# Patient Record
Sex: Male | Born: 1960 | Race: White | Hispanic: No | Marital: Single | Smoking: Current every day smoker
Health system: Southern US, Community
[De-identification: ages and names within clinical notes are randomized; demographics above are authoritative.]

## PROBLEM LIST (undated history)

## (undated) DIAGNOSIS — J329 Chronic sinusitis, unspecified: Secondary | ICD-10-CM

## (undated) HISTORY — DX: Chronic sinusitis, unspecified: J32.9

## (undated) HISTORY — PX: NO PAST SURGERIES: SHX2092

---

## 2010-11-24 ENCOUNTER — Ambulatory Visit: Payer: Self-pay

## 2015-06-10 ENCOUNTER — Ambulatory Visit (INDEPENDENT_AMBULATORY_CARE_PROVIDER_SITE_OTHER): Payer: BLUE CROSS/BLUE SHIELD | Admitting: Family Medicine

## 2015-06-10 ENCOUNTER — Encounter: Payer: Self-pay | Admitting: Family Medicine

## 2015-06-10 VITALS — BP 128/72 | HR 70 | Temp 98.2°F | Resp 16 | Ht 78.0 in | Wt 204.6 lb

## 2015-06-10 DIAGNOSIS — H9201 Otalgia, right ear: Secondary | ICD-10-CM | POA: Diagnosis not present

## 2015-06-10 MED ORDER — NEOMYCIN-POLYMYXIN-HC 3.5-10000-1 OT SOLN: 4.0000 [drp] | Freq: Four times a day (QID) | OTIC | Status: DC

## 2015-06-10 NOTE — Progress Notes (Signed)
Subjective:     Patient ID: Hunter Perez, male   DOB: 03-Jun-1961, 54 y.o.   MRN: [id number]  HPI  Chief Complaint  Patient presents with  . Ear Pain    Patient comes in office today with concerns of ear pain on the right side since Friday, he describes pain as dull and has been gradually getting worse over the past two days  Denies precedent cold/allergy sx, swimming or bruxism. Had recent oral surgery but it did not involve his posterior teeth. He does chew several pieces of gum daily.   Review of Systems  Constitutional: Negative for fever and chills.  HENT: Negative for hearing loss.        Objective:   Physical Exam  HENT:  Left Ear: External ear normal.  Right ear canal patent with normal TM but mildly erythematous when compared to the left. Mild tragal and mastoid area tenderness. No tenderness on percussion of his right posterior molars.  Musculoskeletal:  No TMJ tenderness. Cervical FROM without ear pain       Assessment:    1. Otalgia of right ear - neomycin-polymyxin-hydrocortisone (CORTISPORIN) otic solution; Place 4 drops into the right ear 4 (four) times daily.  Dispense: 10 mL; Refill: 0    Plan:     Start two Aleve twice daily with food. Hold gum chewing.

## 2015-06-10 NOTE — Patient Instructions (Signed)
Discussed use of two Aleve twice daily with food.

## 2015-08-19 ENCOUNTER — Ambulatory Visit
Admission: RE | Admit: 2015-08-19 | Discharge: 2015-08-19 | Disposition: A | Discharge status: Home / Self Care | Payer: BLUE CROSS/BLUE SHIELD | Source: Ambulatory Visit | Attending: Family Medicine | Admitting: Family Medicine

## 2015-08-19 ENCOUNTER — Ambulatory Visit (INDEPENDENT_AMBULATORY_CARE_PROVIDER_SITE_OTHER): Payer: BLUE CROSS/BLUE SHIELD | Admitting: Family Medicine

## 2015-08-19 ENCOUNTER — Encounter: Payer: Self-pay | Admitting: Family Medicine

## 2015-08-19 VITALS — BP 118/80 | HR 90 | Temp 98.3°F | Resp 16 | Wt 207.2 lb

## 2015-08-19 DIAGNOSIS — F17200 Nicotine dependence, unspecified, uncomplicated: Secondary | ICD-10-CM | POA: Insufficient documentation

## 2015-08-19 DIAGNOSIS — M25562 Pain in left knee: Secondary | ICD-10-CM | POA: Diagnosis not present

## 2015-08-19 DIAGNOSIS — L679 Hair color and hair shaft abnormality, unspecified: Secondary | ICD-10-CM

## 2015-08-19 DIAGNOSIS — M11262 Other chondrocalcinosis, left knee: Secondary | ICD-10-CM | POA: Insufficient documentation

## 2015-08-19 DIAGNOSIS — N529 Male erectile dysfunction, unspecified: Secondary | ICD-10-CM | POA: Insufficient documentation

## 2015-08-19 NOTE — Progress Notes (Signed)
Patient ID: Hunter Perez, male   DOB: September 20, 1961, 54 y.o.   MRN: [id number]   Patient: Hunter Perez Male    DOB: May 03, 1961   54 y.o.   MRN: [id number] Visit Date: 08/19/2015  Today's Provider: Dortha Kern, PA   Chief Complaint  Patient presents with  . Knee Pain    left knee pain X 2 days. Patient states he woke up from a nap to knee pain. Patient denies injury to the knee   Subjective:    Knee Pain  The incident occurred 2 days ago. The incident occurred at home. There was no injury mechanism. The pain is present in the left knee. The quality of the pain is described as aching and stabbing. The pain is at a severity of 8/10. The pain has been constant since onset. Associated symptoms include a loss of motion. Pertinent negatives include no muscle weakness or numbness. The symptoms are aggravated by movement.  Initial pain started as he rolled over in bed. Has a history of snow skiing a great deal in the past. No past problems with knees. Unable to flex the knee due to stabbing pain.    Past Surgical History  Procedure Laterality Date  . No past surgeries     Patient Active Problem List   Diagnosis Date Noted  . 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 Tobacco use disorder 08/19/2015  . Hair disease 08/19/2015   No Known Allergies  Previous Medications   ASCORBIC ACID (VITAMIN C PO)    Take 1 tablet by mouth daily.   FLUTICASONE (FLONASE) 50 MCG/ACT NASAL SPRAY    Place 2 sprays into both nostrils at bedtime.   IBUPROFEN (ADVIL,MOTRIN) 200 MG TABLET    Take 200 mg by mouth as needed.   TADALAFIL (CIALIS) 10 MG TABLET    Take 10 mg by mouth daily as needed for erectile dysfunction (limit one every 48 hours).    Review of Systems  Constitutional: Negative.  Negative for fever.  Gastrointestinal: Negative.   Musculoskeletal: Positive for arthralgias. Negative for joint swelling.  Skin: Negative.   Neurological: Negative.  Negative for numbness.   Family History  Problem  Relation Age of Onset  . Healthy Mother   . Healthy Father   . Healthy Brother    Social History  Substance Use Topics  . Smoking status: Current Every Day Smoker -- 1.00 packs/day    Types: Cigarettes  . Smokeless tobacco: Not on file  . Alcohol Use: 0.0 oz/week    0 Standard drinks or equivalent per week     Comment: occasional   Objective:   BP 118/80 mmHg  Pulse 90  Temp(Src) 98.3 F (36.8 C) (Oral)  Resp 16  Wt 207 lb 3.2 oz (93.985 kg)  SpO2 98%  Physical Exam  Constitutional: He is oriented to person, place, and time. He appears well-developed and well-nourished. No distress.  HENT:  Head: Normocephalic and atraumatic.  Right Ear: Hearing normal.  Left Ear: Hearing normal.  Nose: Nose normal.  Eyes: Conjunctivae and lids are normal. Right eye exhibits no discharge. Left eye exhibits no discharge. No scleral icterus.  Pulmonary/Chest: Effort normal. No respiratory distress.  Musculoskeletal: He exhibits tenderness. He exhibits no edema.  Exquisite pain to palpate medial joint line of the left knee. Some friction rub felt to palpate patella. No effusion or swelling. Unable to allow flexion. No pain to test collateral ligaments. Very apprehensive.  Neurological: He is alert and oriented to person, place,  and time.  Skin: Skin is intact. No lesion and no rash noted.  Psychiatric: He has a normal mood and affect. His speech is normal and behavior is normal. Thought content normal.      Assessment & Plan:     1. Left knee pain Onset of stabbing and aching pain in the medial side of the left knee over the past 2 days. Initial pain started when he turned over in bed. No recent trauma/injury known. Has a very long history of snow skiing. 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 Will get x-ray evaluation and use knee immobilizer with Ibuprofen 200 mg 3-4 tablets by mouth TID with food. Elevate and rest as much as possible. Pending report,  may need referral to orthopedist (states he prefers [facility]. - DG Knee Complete 4 Views Left; Future

## 2015-08-20 ENCOUNTER — Telehealth: Payer: Self-pay

## 2015-08-20 NOTE — Telephone Encounter (Signed)
-----   Message from Tamsen Roers, [place] sent at 08/19/2015  6:07 PM EDT ----- No sign of bony abnormality in the knee. Calcifications in the cartilage of the joint noted. If no better with the use of the knee immobilizer and Ibuprofen 600-800 mg TID with food, will need referral to an orthopedist. May apply moist heat or ice packs if needed for additional relief of pain.

## 2015-08-20 NOTE — Telephone Encounter (Signed)
Patient advised as directed below. Patient verbalized understanding and agrees with treatment plan. 

## 2015-08-20 NOTE — Telephone Encounter (Signed)
LMTCB

## 2015-12-18 ENCOUNTER — Telehealth: Payer: Self-pay | Admitting: Family Medicine

## 2015-12-18 NOTE — Telephone Encounter (Signed)
Pt called wanting someone to call him in an antibiotic for a sinus infection.  Offered appt.  He did not want to come in.  Wanted to ask  Nadine CountsBob if he would call something in.  Call back is  [phone]  Thanks Barth Kirkseri

## 2015-12-18 NOTE — Telephone Encounter (Signed)
Left patient a voicemail advising him that Nadine CountsBob states he will need a OV for a RX.

## 2015-12-18 NOTE — Telephone Encounter (Signed)
This will require an office visit.

## 2017-12-09 ENCOUNTER — Other Ambulatory Visit: Payer: Self-pay | Admitting: Physician Assistant

## 2017-12-09 DIAGNOSIS — M1711 Unilateral primary osteoarthritis, right knee: Secondary | ICD-10-CM

## 2017-12-12 ENCOUNTER — Ambulatory Visit: Admission: RE | Admit: 2017-12-12 | Payer: BLUE CROSS/BLUE SHIELD | Source: Ambulatory Visit

## 2017-12-15 ENCOUNTER — Other Ambulatory Visit: Payer: Self-pay | Admitting: Physician Assistant

## 2017-12-15 DIAGNOSIS — M1711 Unilateral primary osteoarthritis, right knee: Secondary | ICD-10-CM

## 2017-12-21 ENCOUNTER — Ambulatory Visit
Admission: RE | Admit: 2017-12-21 | Discharge: 2017-12-21 | Disposition: A | Discharge status: Home / Self Care | Payer: BLUE CROSS/BLUE SHIELD | Source: Ambulatory Visit | Attending: Physician Assistant | Admitting: Physician Assistant

## 2017-12-21 DIAGNOSIS — M25561 Pain in right knee: Secondary | ICD-10-CM | POA: Insufficient documentation

## 2017-12-21 DIAGNOSIS — M1711 Unilateral primary osteoarthritis, right knee: Secondary | ICD-10-CM | POA: Diagnosis not present

## 2017-12-21 DIAGNOSIS — S8391XA Sprain of unspecified site of right knee, initial encounter: Secondary | ICD-10-CM | POA: Insufficient documentation

## 2017-12-21 DIAGNOSIS — M25461 Effusion, right knee: Secondary | ICD-10-CM | POA: Diagnosis not present

## 2017-12-21 DIAGNOSIS — S82191A Other fracture of upper end of right tibia, initial encounter for closed fracture: Secondary | ICD-10-CM | POA: Insufficient documentation

## 2017-12-21 DIAGNOSIS — S83281A Other tear of lateral meniscus, current injury, right knee, initial encounter: Secondary | ICD-10-CM | POA: Insufficient documentation

## 2017-12-21 DIAGNOSIS — X58XXXA Exposure to other specified factors, initial encounter: Secondary | ICD-10-CM | POA: Insufficient documentation

## 2018-07-10 IMAGING — CR DG ORBITS COMPLETE 4+V
2 series · 2 of 2 positions shown · non-contrast
Comparison: None.

CLINICAL DATA: Metal worker.  Assess for MR safety.

EXAM:
ORBITS - COMPLETE 4+ VIEW

[orbits waters (1 of 2)]
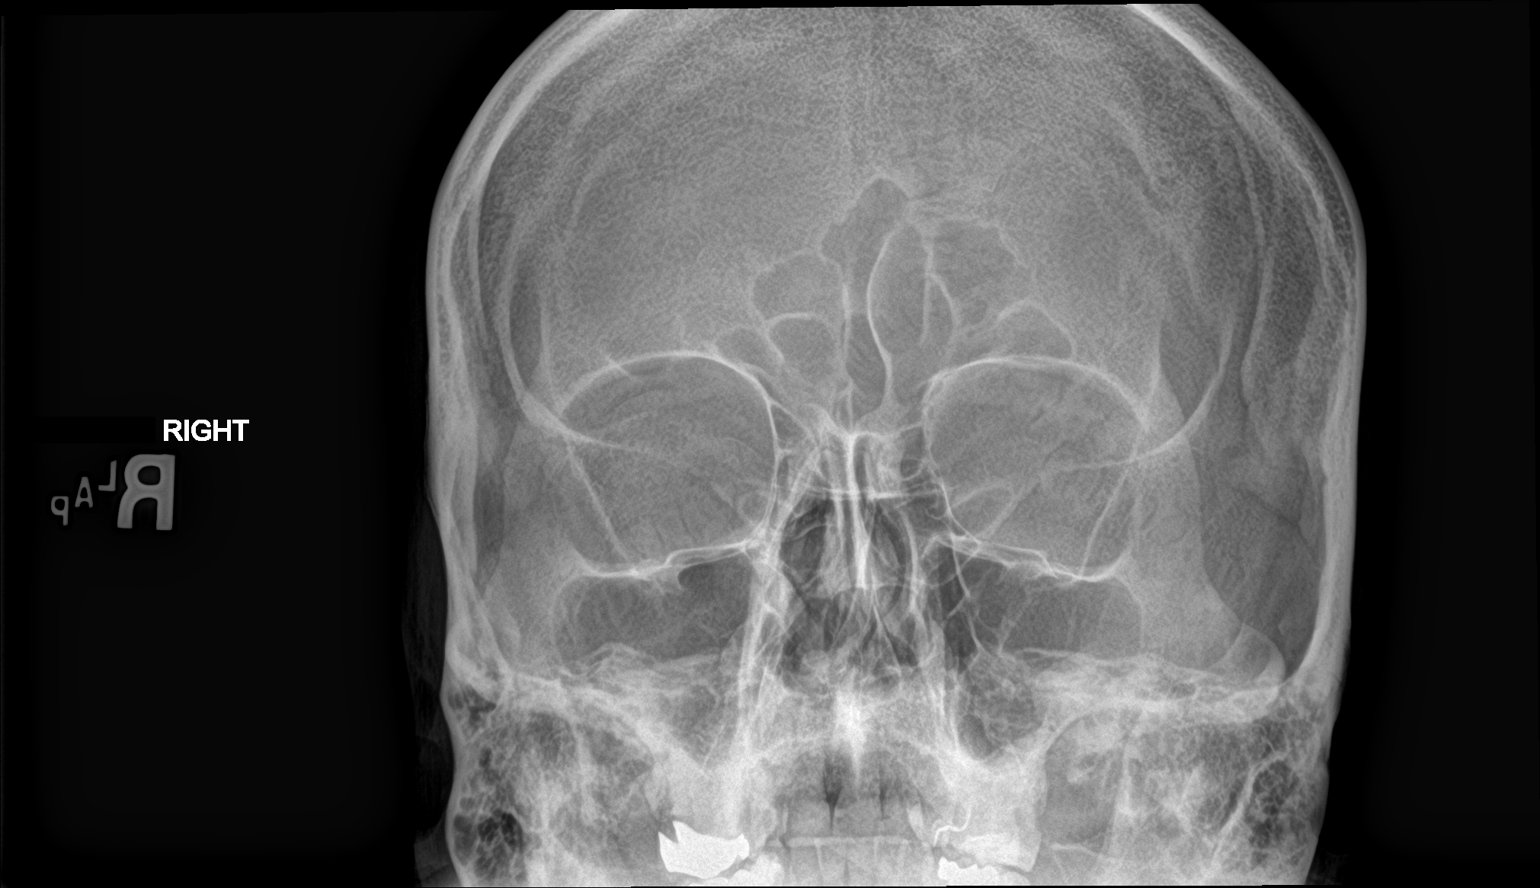

[orbits waters (2 of 2)]
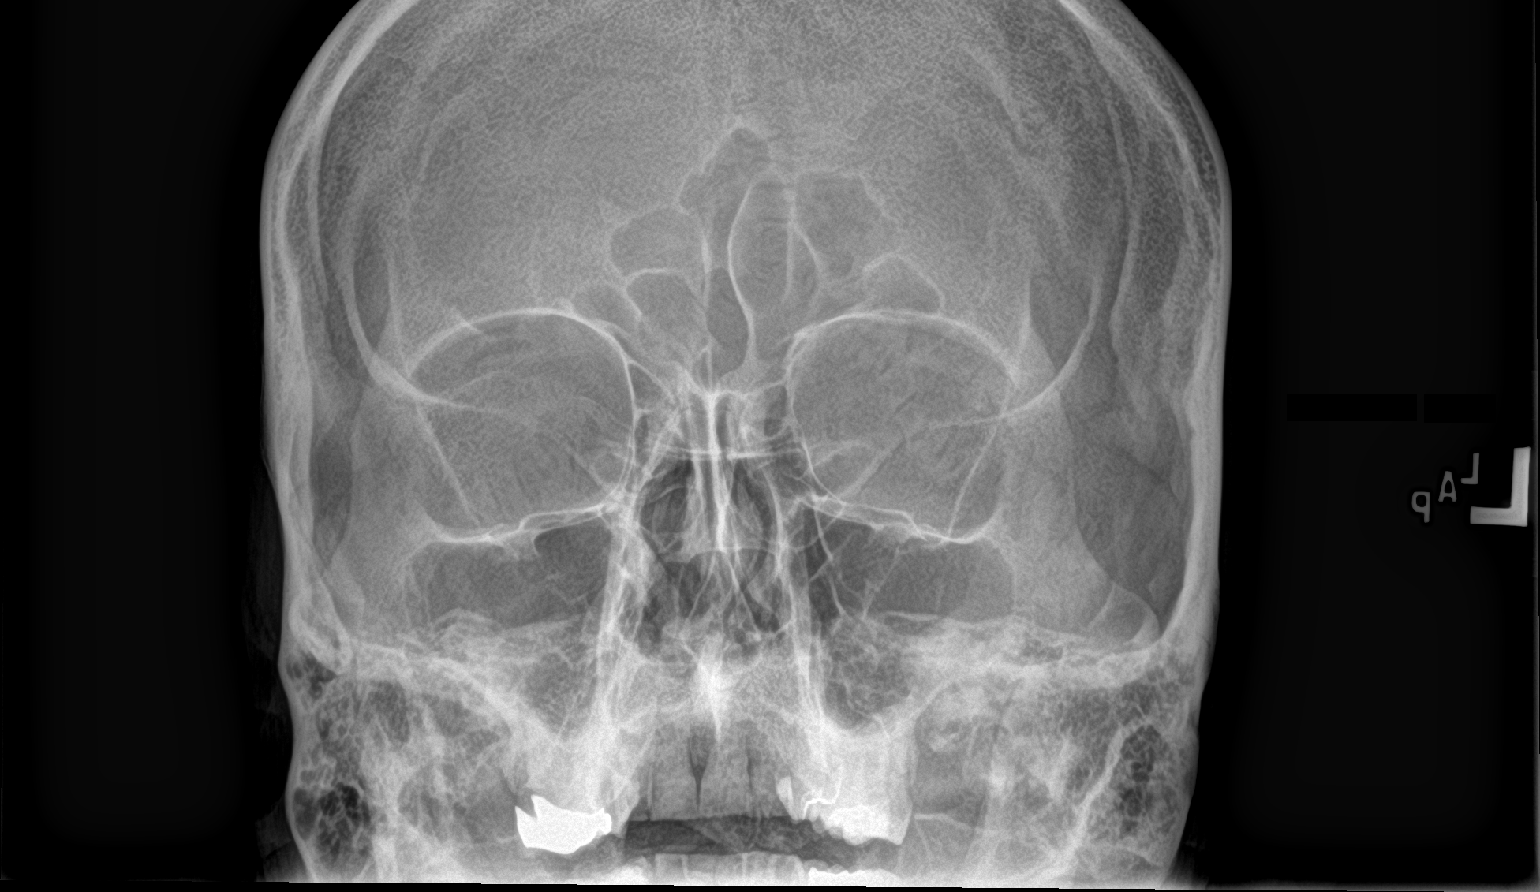

[2 of 2 positions shown; findings below may reference images not displayed]

FINDINGS: No sign of radiopaque foreign object in the region of either orbit.
Sinuses are clear.
IMPRESSION: Negative.  No MR contraindication identified.
